# Patient Record
Sex: Female | Born: 1980 | Race: White | Hispanic: No | Marital: Single | Smoking: Never smoker
Health system: Southern US, Community
[De-identification: ages and names within clinical notes are randomized; demographics above are authoritative.]

## PROBLEM LIST (undated history)

## (undated) DIAGNOSIS — K219 Gastro-esophageal reflux disease without esophagitis: Secondary | ICD-10-CM

## (undated) DIAGNOSIS — J45909 Unspecified asthma, uncomplicated: Secondary | ICD-10-CM

---

## 2019-09-12 ENCOUNTER — Encounter: Payer: Self-pay | Admitting: Emergency Medicine

## 2019-09-12 ENCOUNTER — Emergency Department: Payer: Managed Care, Other (non HMO)

## 2019-09-12 ENCOUNTER — Other Ambulatory Visit: Payer: Self-pay

## 2019-09-12 ENCOUNTER — Emergency Department
Admission: EM | Admit: 2019-09-12 | Discharge: 2019-09-12 | Disposition: A | Discharge status: Home / Self Care | Payer: Managed Care, Other (non HMO) | Attending: Emergency Medicine | Admitting: Emergency Medicine

## 2019-09-12 DIAGNOSIS — J4521 Mild intermittent asthma with (acute) exacerbation: Secondary | ICD-10-CM | POA: Diagnosis not present

## 2019-09-12 DIAGNOSIS — U071 COVID-19: Secondary | ICD-10-CM | POA: Insufficient documentation

## 2019-09-12 DIAGNOSIS — R05 Cough: Secondary | ICD-10-CM | POA: Diagnosis present

## 2019-09-12 HISTORY — DX: Unspecified asthma, uncomplicated: J45.909

## 2019-09-12 HISTORY — DX: Gastro-esophageal reflux disease without esophagitis: K21.9

## 2019-09-12 LAB — CBC
HCT: 41.9 % (ref 36.0–46.0)
Hemoglobin: 14 g/dL (ref 12.0–15.0)
MCH: 26 pg (ref 26.0–34.0)
MCHC: 33.4 g/dL (ref 30.0–36.0)
MCV: 77.9 fL — ABNORMAL LOW (ref 80.0–100.0)
Platelets: 295 10*3/uL (ref 150–400)
RBC: 5.38 MIL/uL — ABNORMAL HIGH (ref 3.87–5.11)
RDW: 15.3 % (ref 11.5–15.5)
WBC: 4.9 10*3/uL (ref 4.0–10.5)
nRBC: 0 % (ref 0.0–0.2)

## 2019-09-12 LAB — BASIC METABOLIC PANEL
Anion gap: 9 (ref 5–15)
BUN: 8 mg/dL (ref 6–20)
CO2: 24 mmol/L (ref 22–32)
Calcium: 8.4 mg/dL — ABNORMAL LOW (ref 8.9–10.3)
Chloride: 103 mmol/L (ref 98–111)
Creatinine, Ser: 0.7 mg/dL (ref 0.44–1.00)
GFR calc Af Amer: >60 mL/min (ref >60)
GFR calc non Af Amer: >60 mL/min (ref >60)
Glucose, Bld: 113 mg/dL — ABNORMAL HIGH (ref 70–99)
Potassium: 3.7 mmol/L (ref 3.5–5.1)
Sodium: 136 mmol/L (ref 135–145)

## 2019-09-12 LAB — HEPATIC FUNCTION PANEL
ALT: 57 U/L — ABNORMAL HIGH (ref 0–44)
AST: 49 U/L — ABNORMAL HIGH (ref 15–41)
Albumin: 3.7 g/dL (ref 3.5–5.0)
Alkaline Phosphatase: 78 U/L (ref 38–126)
Bilirubin, Direct: 0.1 mg/dL (ref 0.0–0.2)
Indirect Bilirubin: 0.2 mg/dL — ABNORMAL LOW (ref 0.3–0.9)
Total Bilirubin: 0.3 mg/dL (ref 0.3–1.2)
Total Protein: 7.8 g/dL (ref 6.5–8.1)

## 2019-09-12 LAB — PROCALCITONIN: Procalcitonin: <0.1 ng/mL

## 2019-09-12 LAB — LACTATE DEHYDROGENASE: LDH: 194 U/L — ABNORMAL HIGH (ref 98–192)

## 2019-09-12 LAB — FIBRIN DERIVATIVES D-DIMER (ARMC ONLY): Fibrin derivatives D-dimer (ARMC): 430.44 ng/mL (FEU) (ref 0.00–499.00)

## 2019-09-12 LAB — C-REACTIVE PROTEIN: CRP: 2.5 mg/dL — ABNORMAL HIGH (ref <1.0)

## 2019-09-12 IMAGING — CR DG CHEST 2V
1 series · 2 of 2 positions shown · non-contrast
Comparison: None.

CLINICAL DATA: Shortness of breath.

EXAM:
CHEST - 2 VIEW

[Series 1: dg chest 2 view · 0.14mm/px · 2 of 2 slices shown]
[im 1/2]
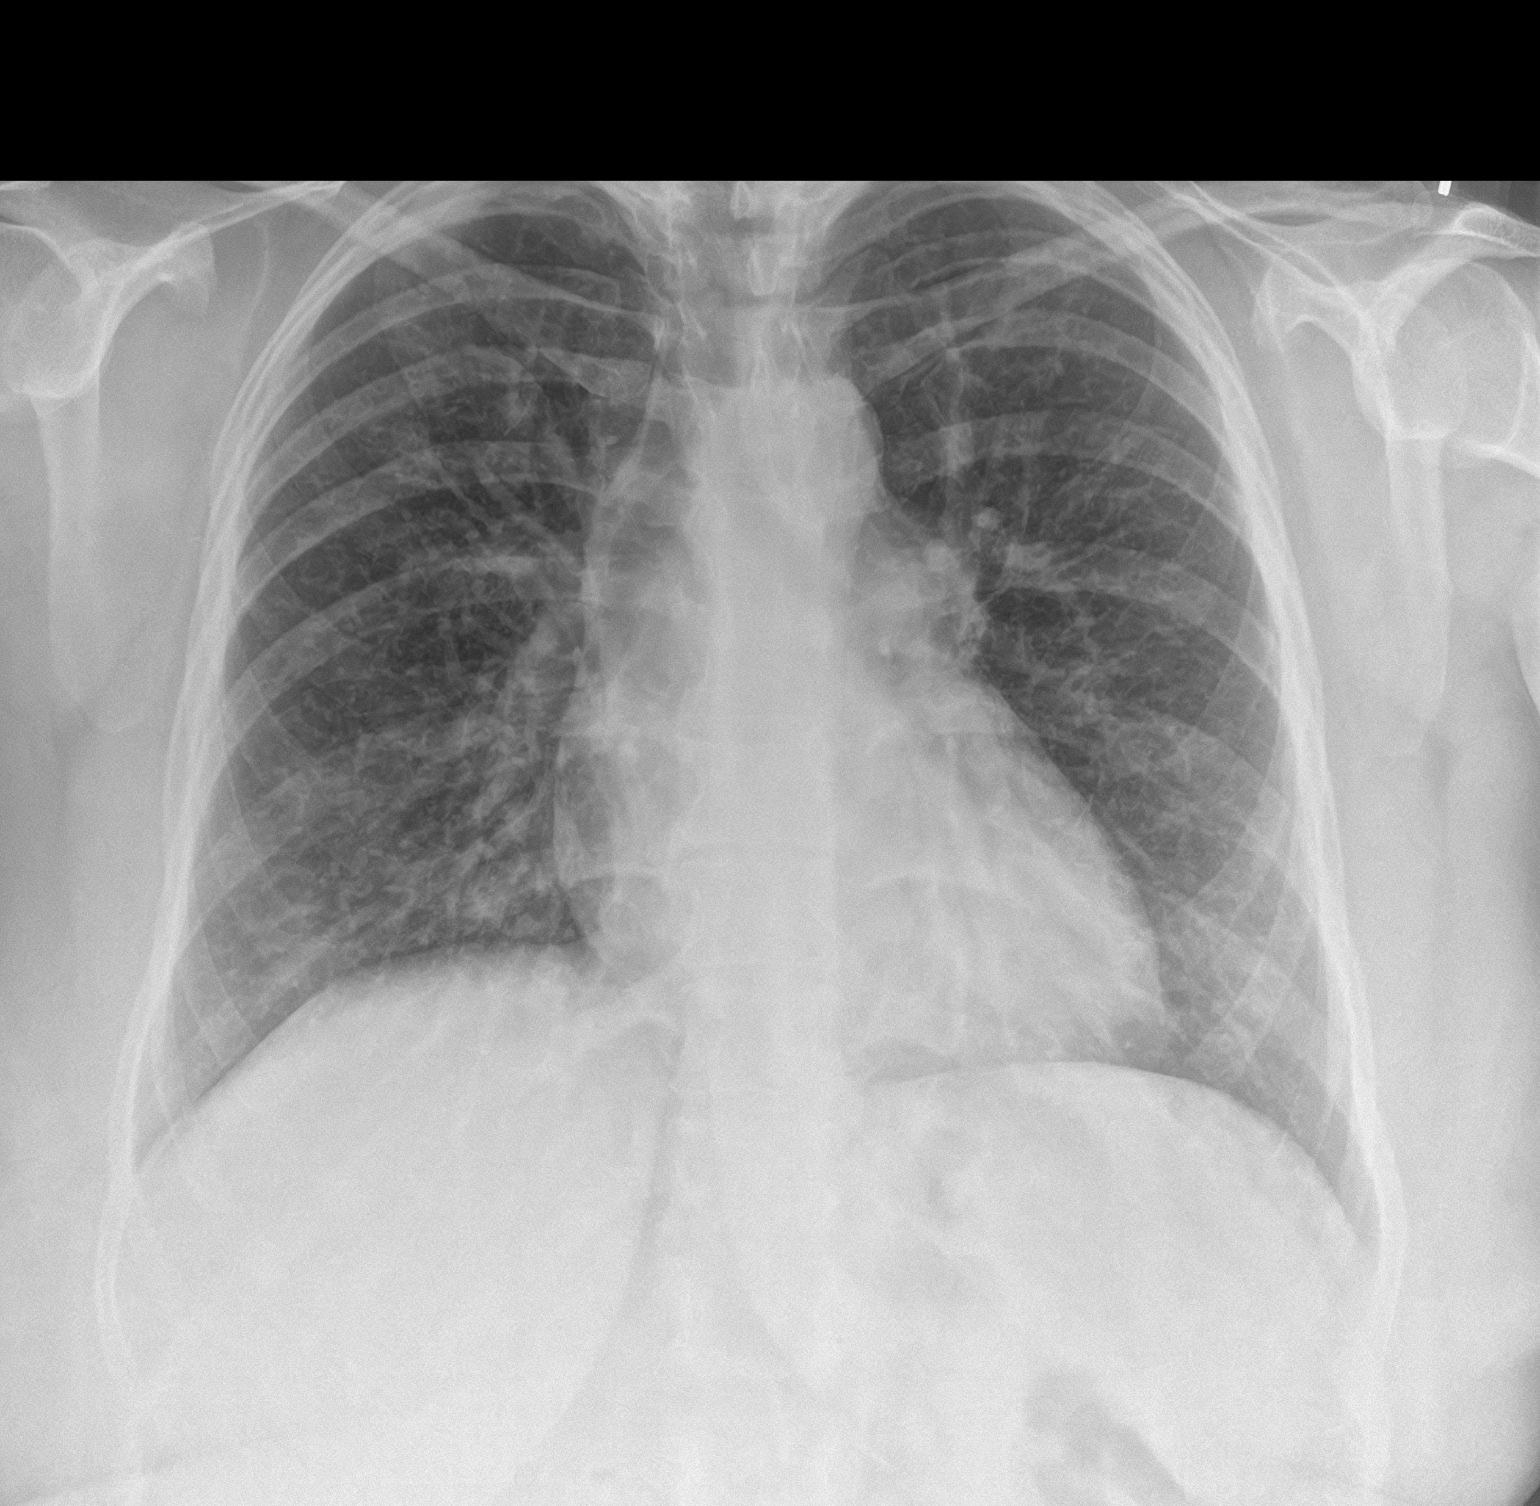
[im 2/2]
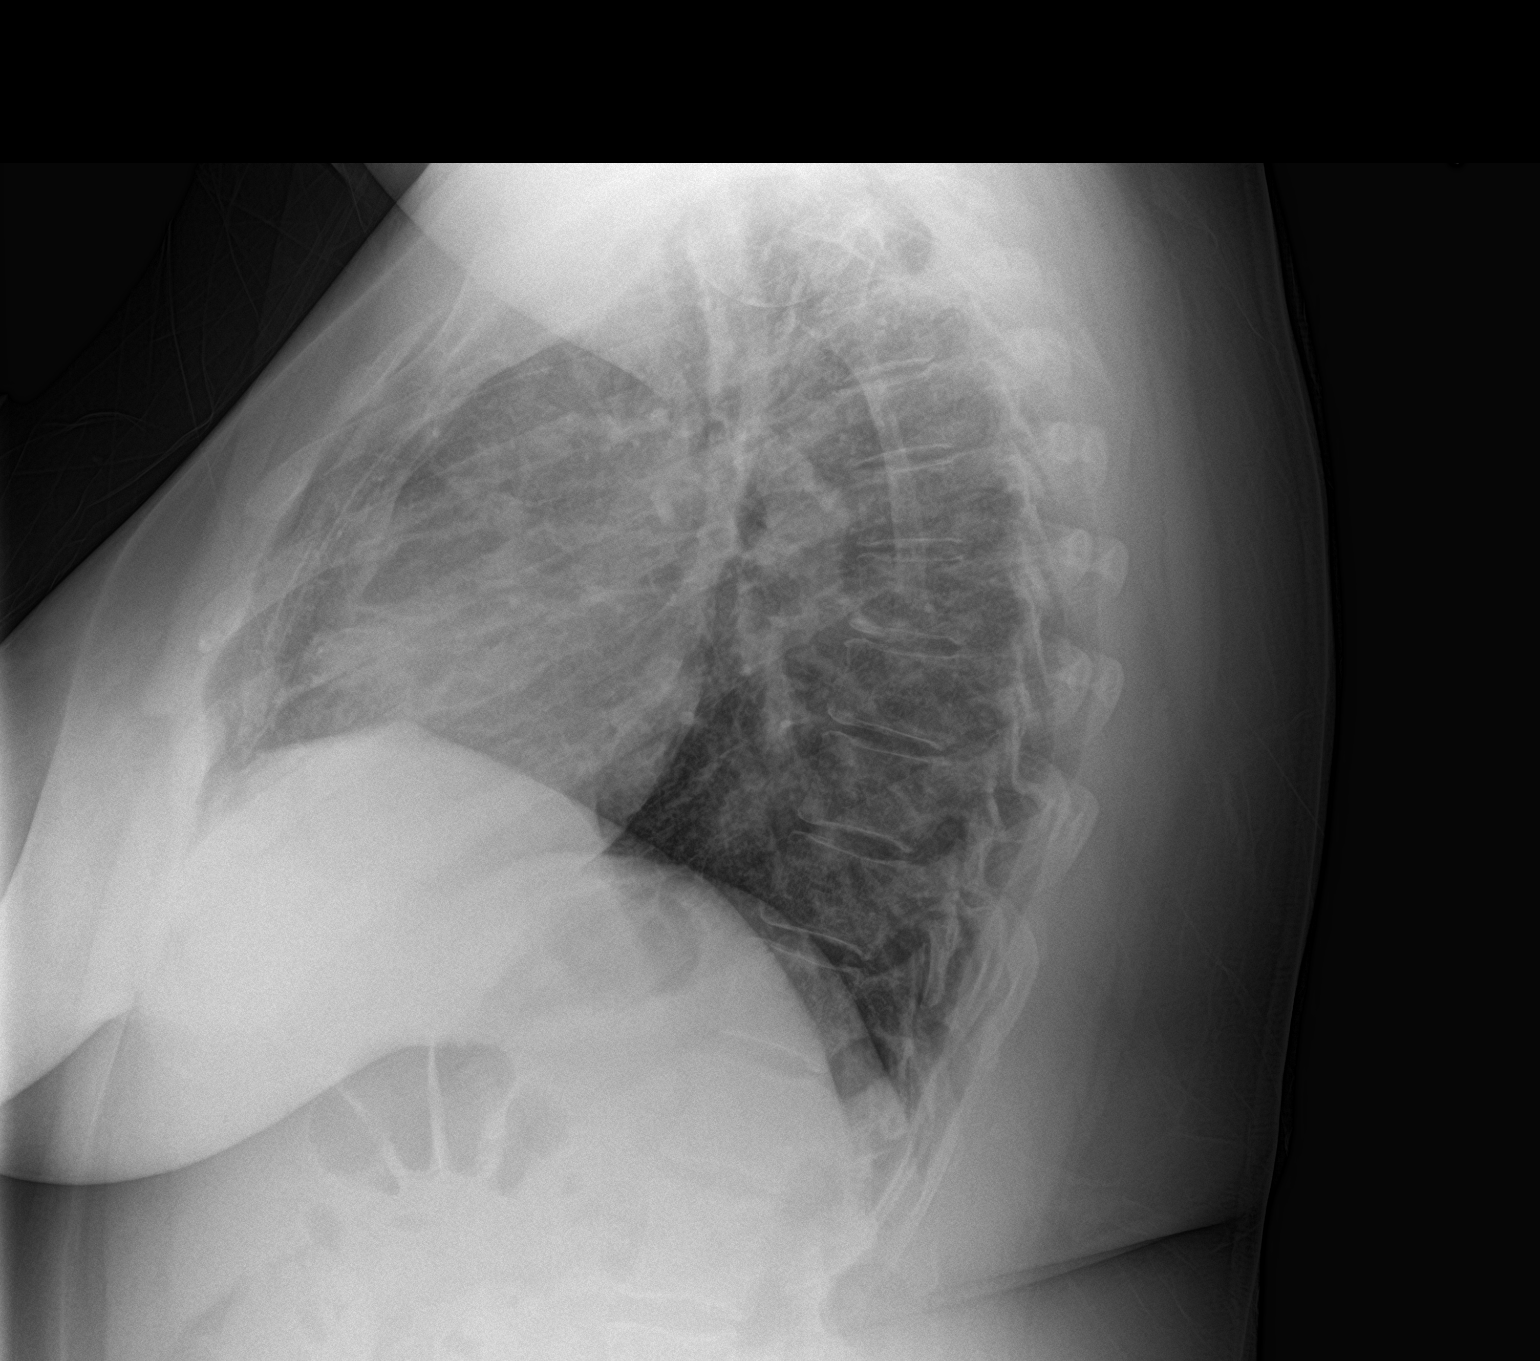

[2 of 2 positions shown; findings below may reference images not displayed]

FINDINGS: There are scattered airspace opacities bilaterally. There is no
pneumothorax. No large pleural effusion. The heart size is
borderline enlarged. There is no acute osseous abnormality.
IMPRESSION: Scatter subtle bilateral airspace opacities which can be seen in
patients with multifocal pneumonia secondary to an atypical
infectious process such as [BU].

## 2019-09-12 MED ORDER — AEROCHAMBER PLUS FLO-VU MEDIUM MISC
1.0000 | Freq: Once | Status: DC
  Filled 2019-09-12: qty 1

## 2019-09-12 MED ORDER — HYDROCOD POLST-CPM POLST ER 10-8 MG/5ML PO SUER: 5.0000 mL | Freq: Two times a day (BID) | ORAL | 0 refills | Status: AC | PRN

## 2019-09-12 MED ORDER — SODIUM CHLORIDE 0.9 % IV BOLUS
1000.0000 mL | Freq: Once | INTRAVENOUS | Status: AC
  Administered 2019-09-12: 1000 mL via INTRAVENOUS

## 2019-09-12 MED ORDER — SUCRALFATE 1 G PO TABS: 1.0000 g | ORAL_TABLET | Freq: Three times a day (TID) | ORAL | 0 refills | Status: AC

## 2019-09-12 MED ORDER — SUCRALFATE 1 G PO TABS: 1.0000 g | ORAL_TABLET | Freq: Three times a day (TID) | ORAL | 0 refills | Status: DC

## 2019-09-12 MED ORDER — PREDNISONE 20 MG PO TABS: 40.0000 mg | ORAL_TABLET | Freq: Every day | ORAL | 0 refills | Status: AC

## 2019-09-12 MED ORDER — IOHEXOL 350 MG/ML SOLN
75.0000 mL | Freq: Once | INTRAVENOUS | Status: AC | PRN
  Administered 2019-09-12: 75 mL via INTRAVENOUS
  Filled 2019-09-12: qty 75

## 2019-09-12 MED ORDER — DEXAMETHASONE SODIUM PHOSPHATE 10 MG/ML IJ SOLN
10.0000 mg | Freq: Once | INTRAMUSCULAR | Status: AC
  Administered 2019-09-12: 10 mg via INTRAVENOUS
  Filled 2019-09-12: qty 1

## 2019-09-12 MED ORDER — KETOROLAC TROMETHAMINE 30 MG/ML IJ SOLN
15.0000 mg | Freq: Once | INTRAMUSCULAR | Status: AC
  Administered 2019-09-12: 18:00:00 15 mg via INTRAVENOUS
  Filled 2019-09-12: qty 1

## 2019-09-12 MED ORDER — AZITHROMYCIN 250 MG PO TABS: ORAL_TABLET | ORAL | 0 refills | Status: AC

## 2019-09-12 MED ORDER — ALBUTEROL SULFATE HFA 108 (90 BASE) MCG/ACT IN AERS
6.0000 | INHALATION_SPRAY | Freq: Once | RESPIRATORY_TRACT | Status: AC
  Administered 2019-09-12: 6 via RESPIRATORY_TRACT
  Filled 2019-09-12: qty 6.7

## 2019-09-12 NOTE — ED Notes (Signed)
This nurse ambulated with pt while monitoring SpO2. Pt O2 started at 97%, dropped down to 95% during ambulation and returned to 97%.

## 2019-09-12 NOTE — Discharge Instructions (Addendum)
As we discussed, I'd recommend 2 puffs every 2-4 hours while awake for the next 24 hours, then as needed   Drink plenty of fluids  Per the WHO and new CDC guidelines, it is OK to take Ibuprofen/NSAIDs in small amounts with COVID. I'd recommend motrin 400 mg every 8 hours, with tylenol as needed.  It was great to meet you! Please call if anything is needed.

## 2019-09-12 NOTE — ED Triage Notes (Signed)
Pt here with c/o worsening shob and cp over the past few days, tested covid + last Tuesday. Speaking in full sentences in triage, states per home pulse ox, 90% on RA this am at home, used her inhaler and it came back up, hx asthma. NAD.

## 2019-09-12 NOTE — ED Provider Notes (Signed)
[facility] Emergency Department Provider Note  ____________________________________________   First MD Initiated Contact with Patient 09/12/19 1544     (approximate)  I have reviewed the triage vital signs and the nursing notes.   HISTORY  Chief Complaint Covid +, Shortness of Breath, and Chest Pain    HPI Bethany Conway is a 38 y.o. female who presents with cough, shortness of breath. Patient is currently approximately 8-9 days into the course of her illness. She was diagnosed last week with COVID-19 after developing fever, cough, and myalgias. She states her sx initially slightly improved but have now returned and are increasingly severe. She has a h/o asthma and reports that over the past 2 days, she's had increasing SOB with wheezing. She has been using her inhaler with mild improvement. She is a [employer] and has been monitoring her O2 Sats at home, which have been as low as but no lower than 90. Her sats have generally improved following albuterol treatment and deep breathing. She reports associated cough, chills, and general fatigue. No significant GI symptoms currently. Pt also reports fairly severe diffuse chest wall pain, worse with inspiration, which has also developed over the past day or two. No leg swelling. No OCP use or h/o DVTs.      Past Medical History:  Diagnosis Date  . Acid reflux   . Asthma     There are no problems to display for this patient.   Past Surgical History:  Procedure Laterality Date  . APPENDECTOMY      Prior to Admission medications   Medication Sig Start Date End Date Taking? Authorizing Provider  azithromycin (ZITHROMAX Z-PAK) 250 MG tablet Take 2 tablets (500 mg) on  Day 1,  followed by 1 tablet (250 mg) once daily on Days 2 through 5. 09/12/19   Duffy Bruce, MD  chlorpheniramine-HYDROcodone [facility] PENNKINETIC ER) 10-8 MG/5ML SUER Take 5 mLs by mouth every 12 (twelve) hours as needed for  up to 8 days for cough. 09/12/19 09/20/19  Duffy Bruce, MD  predniSONE (DELTASONE) 20 MG tablet Take 2 tablets (40 mg total) by mouth daily for 5 days. 09/12/19 09/17/19  Duffy Bruce, MD  sucralfate (CARAFATE) 1 g tablet Take 1 tablet (1 g total) by mouth 4 (four) times daily -  with meals and at bedtime for 5 days. 09/12/19 09/17/19  Duffy Bruce, MD    Allergies Cephalosporins  No family history on file.  Social History Social History   Tobacco Use  . Smoking status: Never Smoker  . Smokeless tobacco: Never Used  Substance Use Topics  . Alcohol use: Never  . Drug use: Not on file    Review of Systems  Review of Systems  Constitutional: Positive for chills and fatigue. Negative for fever.  HENT: Negative for congestion and sore throat.   Eyes: Negative for visual disturbance.  Respiratory: Positive for cough, shortness of breath and wheezing.   Cardiovascular: Positive for chest pain.  Gastrointestinal: Negative for abdominal pain, diarrhea, nausea and vomiting.  Genitourinary: Negative for flank pain.  Musculoskeletal: Negative for back pain and neck pain.  Skin: Negative for rash and wound.  Neurological: Negative for weakness.  All other systems reviewed and are negative.    ____________________________________________  PHYSICAL EXAM:      VITAL SIGNS: ED Triage Vitals [09/12/19 1517]  Enc Vitals Group     BP 124/71     Pulse Rate 92     Resp 16  Temp 99.6 F (37.6 C)     Temp Source Oral     SpO2 100 %     Weight 225 lb (102.1 kg)     Height  (1.626 m)     Head Circumference      Peak Flow      Pain Score 6     Pain Loc      Pain Edu?      Excl. in GC?      Physical Exam Vitals and nursing note reviewed.  Constitutional:      General: She is not in acute distress.    Appearance: She is well-developed.     Comments: Appears to feel unwell, but non-toxic, speaking in full sentences  HENT:     Head: Normocephalic and atraumatic.      Mouth/Throat:     Comments: Mildly dry mucous membranes Eyes:     Conjunctiva/sclera: Conjunctivae normal.  Cardiovascular:     Rate and Rhythm: Regular rhythm. Tachycardia present.     Heart sounds: Normal heart sounds. No murmur. No friction rub.  Pulmonary:     Effort: Pulmonary effort is normal. Tachypnea present. No respiratory distress.     Breath sounds: Examination of the right-upper field reveals wheezing. Examination of the left-upper field reveals wheezing. Examination of the right-middle field reveals wheezing. Examination of the left-middle field reveals wheezing. Examination of the right-lower field reveals wheezing. Examination of the left-lower field reveals wheezing. Decreased breath sounds and wheezing present. No rales.     Comments: Diffuse expiratory wheezes with tachypnea, but overall symmetric and good aeration Abdominal:     General: There is no distension.     Palpations: Abdomen is soft.     Tenderness: There is no abdominal tenderness.  Skin:    General: Skin is warm.     Capillary Refill: Capillary refill takes less than 2 seconds.  Neurological:     Mental Status: She is alert and oriented to person, place, and time.     Motor: No abnormal muscle tone.       ____________________________________________   LABS (all labs ordered are listed, but only abnormal results are displayed)  Labs Reviewed  CBC - Abnormal; Notable for the following components:      Result Value   RBC 5.38 (*)    MCV 77.9 (*)    All other components within normal limits  BASIC METABOLIC PANEL - Abnormal; Notable for the following components:   Glucose, Bld 113 (*)    Calcium 8.4 (*)    All other components within normal limits  HEPATIC FUNCTION PANEL - Abnormal; Notable for the following components:   AST 49 (*)    ALT 57 (*)    Indirect Bilirubin 0.2 (*)    All other components within normal limits  LACTATE DEHYDROGENASE - Abnormal; Notable for the following components:    LDH 194 (*)    All other components within normal limits  C-REACTIVE PROTEIN - Abnormal; Notable for the following components:   CRP 2.5 (*)    All other components within normal limits  PROCALCITONIN  FIBRIN DERIVATIVES D-DIMER ([facility] ONLY)    ____________________________________________  ________________________________________  RADIOLOGY All imaging, including plain films, CT scans, and ultrasounds, independently reviewed by me, and interpretations confirmed via formal radiology reads.  ED MD interpretation:   CXR: Scattered bilateral airspace opacities, c/w COVID-19 CT Angio: Suboptimal bolus timing but no large PE, multifocal PNA as expected w/ COVID  Official radiology report(s): DG  Chest 2 View  Result Date: 09/12/2019 CLINICAL DATA:  Shortness of breath. EXAM: CHEST - 2 VIEW COMPARISON:  None. FINDINGS: There are scattered airspace opacities bilaterally. There is no pneumothorax. No large pleural effusion. The heart size is borderline enlarged. There is no acute osseous abnormality. IMPRESSION: Scatter subtle bilateral airspace opacities which can be seen in patients with multifocal pneumonia secondary to an atypical infectious process such as COVID-19. Electronically Signed   By: Katherine Mantle M.D.   On: 09/12/2019 15:51   CT Angio Chest PE W and/or Wo Contrast  Result Date: 09/12/2019 CLINICAL DATA:  Worsening shortness of breath. COVID positive. EXAM: CT ANGIOGRAPHY CHEST WITH CONTRAST TECHNIQUE: Multidetector CT imaging of the chest was performed using the standard protocol during bolus administration of intravenous contrast. Multiplanar CT image reconstructions and MIPs were obtained to evaluate the vascular anatomy. CONTRAST:  20mL OMNIPAQUE IOHEXOL 350 MG/ML SOLN COMPARISON:  None. FINDINGS: Cardiovascular: Evaluation for pulmonary emboli is nearly nondiagnostic secondary to a very suboptimal contrast bolus timing and extensive respiratory motion artifact. Given this  limitation, no large centrally located pulmonary embolism was detected. Detection of smaller pulmonary emboli is not possible on this exam. The main pulmonary artery is within normal limits for size. There is no CT evidence of acute right heart strain. The visualized aorta is normal. Heart size is normal, without pericardial effusion. Mediastinum/Nodes: --there are mildly enlarged mediastinal and hilar lymph nodes bilaterally. --No axillary lymphadenopathy. --No supraclavicular lymphadenopathy. --Normal thyroid gland. --The esophagus is unremarkable Lungs/Pleura: There is scattered ground-glass airspace opacities bilaterally. There is no pneumothorax. No large pleural effusion. The trachea is unremarkable. Upper Abdomen: No acute abnormality. Musculoskeletal: No chest wall abnormality. No acute or significant osseous findings. Review of the MIP images confirms the above findings. IMPRESSION: 1. Evaluation for pulmonary emboli is nearly nondiagnostic as detailed above. Given these limitations, no PE was identified. 2. Bilateral scattered ground-glass airspace opacities consistent with the patient's history of viral pneumonia. 3. Mildly enlarged mediastinal and hilar lymph nodes, likely reactive. Electronically Signed   By: Katherine Mantle M.D.   On: 09/12/2019 17:31    ____________________________________________  PROCEDURES   Procedure(s) performed (including Critical Care):  Procedures  ____________________________________________  INITIAL IMPRESSION / MDM / ASSESSMENT AND PLAN / ED COURSE  As part of my medical decision making, I reviewed the following data within the electronic MEDICAL RECORD NUMBER Nursing notes reviewed and incorporated, Old chart reviewed, Notes from prior ED visits, and [place] Controlled Substance Database       *THRESIA RAMANATHAN was evaluated in Emergency Department on 09/13/2019 for the symptoms described in the history of present illness. She was evaluated in the context of  the global COVID-19 pandemic, which necessitated consideration that the patient might be at risk for infection with the SARS-CoV-2 virus that causes COVID-19. Institutional protocols and algorithms that pertain to the evaluation of patients at risk for COVID-19 are in a state of rapid change based on information released by regulatory bodies including the CDC and federal and state organizations. These policies and algorithms were followed during the patient's care in the ED.  Some ED evaluations and interventions may be delayed as a result of limited staffing during the pandemic.*  Clinical Course as of Sep 11 2014  Tue Sep 12, 2019  1838 38 yo F here with cough, SOB, currently >1 wk after COVID diagnosis. Suspect ongoing COVID with underlying asthma exacerbation. Lab work is overall reassuring. CBC with normal WBC, no leukopenia. BMP reassuring.  LFTs with minimal transaminitis likely 2/2 viral hepatitis. LDH minimally elevated, no signs of severe disease. Due to significant CP, CT angio obtained. Unfortuantely, bolus timing precludes subsegmental pulmonary artery evaluation. EKG is nonischemic. I suspect her chest pain is 2/2 chest wall pain/pleurisy related to COVID. No large PEs. Will check D-Dimer to further risk stratify.   [CI]    Clinical Course User Index [CI] Shaune PollackIsaacs, Shadara Lopez, MD    Medical Decision Making:  As above. Risk stratification labs are overall reassuring, with only minimally elevated LDH, normal procal, and negative D-Dimer, which in the setting of COVID-19 makes PE highly unlikely.   Pt able to ambulate in ED with sats remaining >90% on RA. Suspect her primary etiology of acute worsening is her asthma superimposed on resolving COVID-19. Nonetheless, given her comorbidities and duration of illness, I do feel it's reasonable to prescribe a short course of prednisone and azithromycin. Will encourage scheduled albuterol x 24 hours, good hydration, and good return  precautions.  ____________________________________________  FINAL CLINICAL IMPRESSION(S) / ED DIAGNOSES  Final diagnoses:  COVID-19  Mild intermittent asthma with exacerbation     MEDICATIONS GIVEN DURING THIS VISIT:  Medications  dexamethasone (DECADRON) injection 10 mg (10 mg Intravenous Given 09/12/19 1649)  ketorolac (TORADOL) 30 MG/ML injection 15 mg (15 mg Intravenous Given 09/12/19 1738)  albuterol (VENTOLIN HFA) 108 (90 Base) MCG/ACT inhaler 6 puff (6 puffs Inhalation Given 09/12/19 1748)  sodium chloride 0.9 % bolus 1,000 mL (0 mLs Intravenous Stopped 09/12/19 1926)  iohexol (OMNIPAQUE) 350 MG/ML injection 75 mL (75 mLs Intravenous Contrast Given 09/12/19 1719)     ED Discharge Orders         Ordered    predniSONE (DELTASONE) 20 MG tablet  Daily     09/12/19 1917    azithromycin (ZITHROMAX Z-PAK) 250 MG tablet     09/12/19 1917    chlorpheniramine-HYDROcodone (TUSSIONEX PENNKINETIC ER) 10-8 MG/5ML SUER  Every 12 hours PRN     09/12/19 1917    sucralfate (CARAFATE) 1 g tablet  3 times daily with meals & bedtime,   Status:  Discontinued     09/12/19 1938    sucralfate (CARAFATE) 1 g tablet  3 times daily with meals & bedtime     09/12/19 1938           Note:  This document was prepared using Dragon voice recognition software and may include unintentional dictation errors.   Shaune PollackIsaacs, Karenann Mcgrory, MD 09/13/19 [phone]
